# Patient Record
Sex: Female | Born: 1970 | Race: White | Hispanic: No | Marital: Married | Smoking: Never smoker
Health system: Southern US, Community
[De-identification: ages and names within clinical notes are randomized; demographics above are authoritative.]

## PROBLEM LIST (undated history)

## (undated) DIAGNOSIS — Z8711 Personal history of peptic ulcer disease: Secondary | ICD-10-CM

## (undated) HISTORY — PX: NO PAST SURGERIES: SHX2092

---

## 2005-07-14 ENCOUNTER — Ambulatory Visit: Payer: Self-pay | Admitting: Family Medicine

## 2006-09-16 ENCOUNTER — Other Ambulatory Visit: Admission: RE | Admit: 2006-09-16 | Discharge: 2006-09-16 | Payer: Self-pay | Admitting: Gynecology

## 2008-03-29 ENCOUNTER — Other Ambulatory Visit: Admission: RE | Admit: 2008-03-29 | Discharge: 2008-03-29 | Payer: Self-pay | Admitting: Gynecology

## 2009-08-23 ENCOUNTER — Encounter: Admission: RE | Admit: 2009-08-23 | Discharge: 2009-08-23 | Payer: Self-pay | Admitting: Gynecology

## 2010-06-27 ENCOUNTER — Encounter: Admission: RE | Admit: 2010-06-27 | Discharge: 2010-06-27 | Payer: Self-pay | Admitting: Gynecology

## 2012-08-17 ENCOUNTER — Other Ambulatory Visit: Payer: Self-pay | Admitting: Gynecology

## 2012-08-17 DIAGNOSIS — R928 Other abnormal and inconclusive findings on diagnostic imaging of breast: Secondary | ICD-10-CM

## 2012-08-25 ENCOUNTER — Ambulatory Visit
Admission: RE | Admit: 2012-08-25 | Discharge: 2012-08-25 | Disposition: A | Discharge status: Home / Self Care | Payer: Managed Care, Other (non HMO) | Source: Ambulatory Visit | Attending: Gynecology | Admitting: Gynecology

## 2012-08-25 DIAGNOSIS — R928 Other abnormal and inconclusive findings on diagnostic imaging of breast: Secondary | ICD-10-CM

## 2015-03-30 ENCOUNTER — Other Ambulatory Visit: Payer: Self-pay | Admitting: Family Medicine

## 2015-03-30 NOTE — Telephone Encounter (Signed)
Left msg for patient. Asked her to call with any questions.

## 2015-03-30 NOTE — Telephone Encounter (Signed)
She has finished the prescription vitamin D Have her start over-the-counter vitamin D3, take 2,000 iu daily We can recheck that level in another few months

## 2017-12-24 ENCOUNTER — Other Ambulatory Visit: Payer: Self-pay | Admitting: Obstetrics & Gynecology

## 2017-12-24 DIAGNOSIS — R928 Other abnormal and inconclusive findings on diagnostic imaging of breast: Secondary | ICD-10-CM

## 2017-12-30 ENCOUNTER — Ambulatory Visit
Admission: RE | Admit: 2017-12-30 | Discharge: 2017-12-30 | Disposition: A | Discharge status: Home / Self Care | Payer: Managed Care, Other (non HMO) | Source: Ambulatory Visit | Attending: Obstetrics & Gynecology | Admitting: Obstetrics & Gynecology

## 2017-12-30 ENCOUNTER — Other Ambulatory Visit: Payer: Self-pay | Admitting: Obstetrics & Gynecology

## 2017-12-30 DIAGNOSIS — R921 Mammographic calcification found on diagnostic imaging of breast: Secondary | ICD-10-CM

## 2017-12-30 DIAGNOSIS — R928 Other abnormal and inconclusive findings on diagnostic imaging of breast: Secondary | ICD-10-CM

## 2018-07-07 ENCOUNTER — Other Ambulatory Visit: Payer: Self-pay | Admitting: Obstetrics & Gynecology

## 2018-07-07 ENCOUNTER — Ambulatory Visit
Admission: RE | Admit: 2018-07-07 | Discharge: 2018-07-07 | Disposition: A | Discharge status: Home / Self Care | Payer: BLUE CROSS/BLUE SHIELD | Source: Ambulatory Visit | Attending: Obstetrics & Gynecology | Admitting: Obstetrics & Gynecology

## 2018-07-07 DIAGNOSIS — R921 Mammographic calcification found on diagnostic imaging of breast: Secondary | ICD-10-CM

## 2018-09-15 ENCOUNTER — Ambulatory Visit (INDEPENDENT_AMBULATORY_CARE_PROVIDER_SITE_OTHER): Payer: 59

## 2018-09-15 ENCOUNTER — Encounter: Payer: Self-pay | Admitting: Podiatry

## 2018-09-15 ENCOUNTER — Ambulatory Visit (INDEPENDENT_AMBULATORY_CARE_PROVIDER_SITE_OTHER): Payer: 59 | Admitting: Podiatry

## 2018-09-15 VITALS — BP 125/70 | HR 75 | Resp 16

## 2018-09-15 DIAGNOSIS — M2041 Other hammer toe(s) (acquired), right foot: Secondary | ICD-10-CM

## 2018-09-15 DIAGNOSIS — M722 Plantar fascial fibromatosis: Secondary | ICD-10-CM

## 2018-09-15 MED ORDER — MELOXICAM 15 MG PO TABS: 15.0000 mg | ORAL_TABLET | Freq: Every day | ORAL | 3 refills | Status: DC

## 2018-09-15 MED ORDER — METHYLPREDNISOLONE 4 MG PO TBPK: ORAL_TABLET | ORAL | 0 refills | Status: DC

## 2018-09-15 NOTE — Progress Notes (Signed)
  Subjective:  Patient ID: Kelly Berg, female    DOB: 03/12/1971,  MRN: [id number] HPI Chief Complaint  Patient presents with  . Foot Pain    Plantar heel left - aching x 1 week, AM pain, tried Ibuprofen, history of PF  . Toe Pain    2nd toe right - hammer toe deformity x years, getting worse, shoes uncomfortable  . New Patient (Initial Visit)    48 y.o. female presents with the above complaint.   ROS: Denies fever chills nausea vomiting muscle aches pains calf pain back pain chest pain shortness of breath.  No past medical history on file. No past surgical history on file.  Current Outpatient Medications:  .  meloxicam (MOBIC) 15 MG tablet, Take 1 tablet (15 mg total) by mouth daily., Disp: 30 tablet, Rfl: 3 .  methylPREDNISolone (MEDROL DOSEPAK) 4 MG TBPK tablet, 6 day dose pack - take as directed, Disp: 21 tablet, Rfl: 0  Allergies  Allergen Reactions  . Amoxicillin Hives  . Codeine Nausea Only  . Guaifenesin     Other reaction(s): Vomiting   Review of Systems Objective:   Vitals:   09/15/18 0934  BP: 125/70  Pulse: 75  Resp: 16    General: Well developed, nourished, in no acute distress, alert and oriented x3   Dermatological: Skin is warm, dry and supple bilateral. Nails x 10 are well maintained; remaining integument appears unremarkable at this time. There are no open sores, no preulcerative lesions, no rash or signs of infection present.  Vascular: Dorsalis Pedis artery and Posterior Tibial artery pedal pulses are 2/4 bilateral with immedate capillary fill time. Pedal hair growth present. No varicosities and no lower extremity edema present bilateral.   Neruologic: Grossly intact via light touch bilateral. Vibratory intact via tuning fork bilateral. Protective threshold with Semmes Wienstein monofilament intact to all pedal sites bilateral. Patellar and Achilles deep tendon reflexes 2+ bilateral. No Babinski or clonus noted bilateral.   Musculoskeletal: No  gross boney pedal deformities bilateral. No pain, crepitus, or limitation noted with foot and ankle range of motion bilateral. Muscular strength 5/5 in all groups tested bilateral.  Gait: Unassisted, Nonantalgic.    Radiographs:  Radiographs taken today demonstrate severe hallux valgus deformity of the right foot with severe hammertoe deformity second left and a plantarflexed second metatarsal right.  Soft tissue increase in density plantar fascial kidney insertion site of the left heel.  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  Plantar fasciitis left.  Plan: Discussed etiology pathology and surgical therapies at this point injected 20 mg Kenalog 5 mg Marcaine point maximal tenderness of the left to left sterile Betadine skin prep.  Tolerated procedure well without complications.  Started her on a Medrol Dosepak to be followed by meloxicam.  She has a plantar fascial night splint at home but we dispensed a plantar fascial brace.  Gust appropriate shoe gear stretching exercise ice therapy.  Great detail today we discussed the reduction of her bunion deformity with a Lapidus procedure bunionectomy and a second metatarsal osteotomy with hammertoe repair.  She understands all this is amenable to it would like for her husband to hear this so we will bring her husband with her on follow-up visit and follow-up in 1 month to discuss not only surgery but to reevaluate the plantar fasciitis left.      T. [place], [place]

## 2018-09-15 NOTE — Patient Instructions (Signed)

## 2018-10-20 ENCOUNTER — Ambulatory Visit: Payer: 59 | Admitting: Podiatry

## 2018-10-27 ENCOUNTER — Encounter: Payer: Self-pay | Admitting: Podiatry

## 2018-10-27 ENCOUNTER — Ambulatory Visit (INDEPENDENT_AMBULATORY_CARE_PROVIDER_SITE_OTHER): Payer: 59 | Admitting: Podiatry

## 2018-10-27 DIAGNOSIS — M722 Plantar fascial fibromatosis: Secondary | ICD-10-CM

## 2018-10-27 MED ORDER — DICLOFENAC SODIUM 75 MG PO TBEC: 75.0000 mg | DELAYED_RELEASE_TABLET | Freq: Two times a day (BID) | ORAL | 1 refills | Status: DC

## 2018-10-27 NOTE — Progress Notes (Signed)
She presents today for follow-up of her left heel.  She states that seems to be feeling better.  He still have some questions regarding the surgery as she refers to her right foot.  Objective: Vital signs are stable she is alert and oriented x3.  Pulses are palpable.  She has mild tenderness on palpation medial calcaneal tubercle of the left heel.  She has some questions regarding the Lapidus procedure second metatarsal osteotomy and hammertoe repair second right.  Answered all those the best my ability layman's terms she understood was amenable to it.  Assessment: Slowly resolving plantar fasciitis left.  Hallux valgus deformity right.  Plan: Answer all the questions regarding these procedures best my billing layman's terms she understood was amenable to it.  I also reinjected the left heel today 20 mg Kenalog 5 mg Marcaine for maximal tenderness.  I will follow-up with her when she is decided about when she would like to have this done.

## 2018-12-08 ENCOUNTER — Ambulatory Visit: Payer: 59 | Admitting: Podiatry

## 2019-01-05 ENCOUNTER — Ambulatory Visit: Payer: 59 | Admitting: Podiatry

## 2019-02-02 ENCOUNTER — Other Ambulatory Visit: Payer: Self-pay

## 2019-02-02 ENCOUNTER — Ambulatory Visit (INDEPENDENT_AMBULATORY_CARE_PROVIDER_SITE_OTHER): Payer: 59 | Admitting: Podiatry

## 2019-02-02 ENCOUNTER — Encounter: Payer: Self-pay | Admitting: Podiatry

## 2019-02-02 VITALS — Temp 98.2°F

## 2019-02-02 DIAGNOSIS — M722 Plantar fascial fibromatosis: Secondary | ICD-10-CM

## 2019-02-02 NOTE — Progress Notes (Signed)
Follow-up left heel it was okay until I wore some sandals now starting to hurt again.  Objective: Vital signs are stable alert and oriented x3.  Pulses palpable.  She has pain to palpation medial [place] tubercle left heel.  Assessment: Plantar fasciitis chronic in nature left.  Plan: Went ahead and injected the left heel again today 20 mg Kenalog 5 mg Marcaine point of maximal tenderness.  Tolerated procedure well follow-up with me in a month if necessary.

## 2019-02-16 ENCOUNTER — Other Ambulatory Visit: Payer: Self-pay

## 2019-02-16 ENCOUNTER — Ambulatory Visit
Admission: RE | Admit: 2019-02-16 | Discharge: 2019-02-16 | Disposition: A | Discharge status: Home / Self Care | Payer: 59 | Source: Ambulatory Visit | Attending: Obstetrics & Gynecology | Admitting: Obstetrics & Gynecology

## 2019-02-16 DIAGNOSIS — R921 Mammographic calcification found on diagnostic imaging of breast: Secondary | ICD-10-CM

## 2019-05-03 ENCOUNTER — Other Ambulatory Visit: Payer: Self-pay

## 2019-05-03 ENCOUNTER — Ambulatory Visit
Admission: EM | Admit: 2019-05-03 | Discharge: 2019-05-03 | Disposition: A | Discharge status: Home / Self Care | Payer: 59 | Attending: Family Medicine | Admitting: Family Medicine

## 2019-05-03 DIAGNOSIS — W5501XA Bitten by cat, initial encounter: Secondary | ICD-10-CM

## 2019-05-03 DIAGNOSIS — S61207A Unspecified open wound of left little finger without damage to nail, initial encounter: Secondary | ICD-10-CM | POA: Diagnosis not present

## 2019-05-03 MED ORDER — METRONIDAZOLE 500 MG PO TABS: 500.0000 mg | ORAL_TABLET | Freq: Three times a day (TID) | ORAL | 0 refills | Status: AC

## 2019-05-03 MED ORDER — DOXYCYCLINE HYCLATE 100 MG PO CAPS: 100.0000 mg | ORAL_CAPSULE | Freq: Two times a day (BID) | ORAL | 0 refills | Status: AC

## 2019-05-03 NOTE — ED Triage Notes (Addendum)
Patient complains of cat bite to her left hand little finger. Patient states that this occurred while at work and cat is UTD on vaccines.

## 2019-05-03 NOTE — Discharge Instructions (Signed)
Keep dressing on for 24 hours.  Medication as directed.  Take care  Dr. Lacinda Axon

## 2019-05-04 NOTE — ED Provider Notes (Signed)
[facility]    CSN: [id number] Arrival date & time: 05/03/19  1634  History   Chief Complaint Chief Complaint  Patient presents with  . Animal Bite    left ring finger   HPI  48 year old female presents with the above complaint.  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  She states that this is not filing this as a Designer, multimedia[employer]men's Comp. injury.  She reports she was bitten by cat approximately 1 hour prior to arrival.  States that she was bitten on her left fifth digit.  She states that the cat is up-to-date on her vaccinations.  Wound is currently bleeding.  Pain is mild, 2/10 in severity.  He has cleaned the area with surgical scrub and has a dressing applied.  No medications or other interventions tried.  Area continues to bleed.  No other reported injuries.  No other complaints or concerns at this time.  PMH, Surgical Hx, Family Hx, Social History reviewed and updated as below.  PMH: Obesity, Plantar fasciitis   Past Surgical History:  Procedure Laterality Date  . NO PAST SURGERIES      OB History   No obstetric history on file.    Home Medications    Prior to Admission medications   Medication Sig Start Date End Date Taking? Authorizing Provider  doxycycline (VIBRAMYCIN) 100 MG capsule Take 1 capsule (100 mg total) by mouth 2 (two) times daily for 7 days. 05/03/19 05/10/19  Tommie Samsook, Hiroto Saltzman G, DO  metroNIDAZOLE (FLAGYL) 500 MG tablet Take 1 tablet (500 mg total) by mouth 3 (three) times daily for 7 days. 05/03/19 05/10/19  Tommie Samsook, Simrin Vegh G, DO    Family History Family History  Problem Relation Age of Onset  . Breast cancer Cousin 40  . Cancer Mother   . Diabetes Mother     Social History Social History   Tobacco Use  . Smoking status: Never Smoker  . Smokeless tobacco: Never Used  Substance Use Topics  . Alcohol use: Not Currently    Frequency: Never  . Drug use: Never     Allergies   Amoxicillin, Codeine, and Guaifenesin   Review of Systems Review of  Systems  Constitutional: Negative.   Skin: Positive for wound.   Physical Exam Triage Vital Signs ED Triage Vitals  Enc Vitals Group     BP 05/03/19 1652 (!) 147/89     Pulse Rate 05/03/19 1652 77     Resp 05/03/19 1652 16     Temp 05/03/19 1652 98.2 F (36.8 C)     Temp Source 05/03/19 1652 Oral     SpO2 05/03/19 1652 97 %     Weight 05/03/19 1651 200 lb (90.7 kg)     Height 05/03/19 1651 5\' 8"  (1.727 m)     Head Circumference --      Peak Flow --      Pain Score 05/03/19 1650 2     Pain Loc --      Pain Edu? --      Excl. in GC? --    Updated Vital Signs BP (!) 147/89 (BP Location: Left Arm)   Pulse 77   Temp 98.2 F (36.8 C) (Oral)   Resp 16   Ht 5\' 8"  (1.727 m)   Wt 90.7 kg   SpO2 97%   BMI 30.41 kg/m   Visual Acuity Right Eye Distance:   Left Eye Distance:   Bilateral Distance:    Right Eye Near:   Left Eye Near:  Bilateral Near:     Physical Exam Vitals signs and nursing note reviewed.  Constitutional:      General: She is not in acute distress.    Appearance: Normal appearance. She is not ill-appearing.  HENT:     Head: Normocephalic and atraumatic.  Eyes:     General:        Right eye: No discharge.        Left eye: No discharge.     Conjunctiva/sclera: Conjunctivae normal.  Cardiovascular:     Rate and Rhythm: Normal rate and regular rhythm.  Pulmonary:     Effort: Pulmonary effort is normal.     Breath sounds: Normal breath sounds. No wheezing, rhonchi or rales.  Skin:    Comments: Left 5th digit - small, open bleeding wound noted just distal to the DIP joint (palmar aspect).  Neurological:     Mental Status: She is alert.  Psychiatric:        Mood and Affect: Mood normal.        Behavior: Behavior normal.    UC Treatments / Results  Labs (all labs ordered are listed, but only abnormal results are displayed) Labs Reviewed - No data to display  EKG   Radiology No results found.  Procedures Procedures (including critical  care time)  Medications Ordered in UC Medications - No data to display  Initial Impression / Assessment and Plan / UC Course  I have reviewed the triage vital signs and the nursing notes.  Pertinent labs & imaging results that were available during my care of the patient were reviewed by me and considered in my medical decision making (see chart for details).    48 year old female presents with a cat bite.  Wound left open.  Wound was dressed today.  Placing on doxycycline and Flagyl as patient is allergic to amoxicillin.  Final Clinical Impressions(s) / UC Diagnoses   Final diagnoses:  Cat bite, initial encounter     Discharge Instructions     Keep dressing on for 24 hours.  Medication as directed.  Take care  Dr. Lacinda Axon    ED Prescriptions    Medication Sig Dispense Auth. Provider   doxycycline (VIBRAMYCIN) 100 MG capsule Take 1 capsule (100 mg total) by mouth 2 (two) times daily for 7 days. 14 capsule Ketih Goodie G, DO   metroNIDAZOLE (FLAGYL) 500 MG tablet Take 1 tablet (500 mg total) by mouth 3 (three) times daily for 7 days. 21 tablet Coral Spikes, DO     Controlled Substance Prescriptions [place] Controlled Substance Registry consulted? Not Applicable   Coral Spikes, [place] 05/04/19 9150

## 2019-09-01 ENCOUNTER — Ambulatory Visit: Payer: Managed Care, Other (non HMO) | Attending: Internal Medicine

## 2019-09-01 DIAGNOSIS — Z20822 Contact with and (suspected) exposure to covid-19: Secondary | ICD-10-CM

## 2019-09-02 LAB — NOVEL CORONAVIRUS, NAA: SARS-CoV-2, NAA: NOT DETECTED

## 2019-09-16 ENCOUNTER — Ambulatory Visit: Payer: Managed Care, Other (non HMO) | Attending: Internal Medicine

## 2019-09-16 DIAGNOSIS — Z20822 Contact with and (suspected) exposure to covid-19: Secondary | ICD-10-CM

## 2019-09-18 LAB — NOVEL CORONAVIRUS, NAA: SARS-CoV-2, NAA: NOT DETECTED

## 2019-09-28 DIAGNOSIS — S86819A Strain of other muscle(s) and tendon(s) at lower leg level, unspecified leg, initial encounter: Secondary | ICD-10-CM | POA: Insufficient documentation

## 2020-01-31 DIAGNOSIS — M7062 Trochanteric bursitis, left hip: Secondary | ICD-10-CM | POA: Insufficient documentation

## 2020-03-28 ENCOUNTER — Other Ambulatory Visit: Payer: Self-pay | Admitting: Obstetrics & Gynecology

## 2020-03-28 DIAGNOSIS — Z1231 Encounter for screening mammogram for malignant neoplasm of breast: Secondary | ICD-10-CM

## 2020-03-28 DIAGNOSIS — R921 Mammographic calcification found on diagnostic imaging of breast: Secondary | ICD-10-CM

## 2020-04-10 ENCOUNTER — Other Ambulatory Visit: Payer: Self-pay | Admitting: Obstetrics and Gynecology

## 2020-04-25 ENCOUNTER — Ambulatory Visit
Admission: RE | Admit: 2020-04-25 | Discharge: 2020-04-25 | Disposition: A | Discharge status: Home / Self Care | Payer: BC Managed Care – PPO | Source: Ambulatory Visit | Attending: Obstetrics & Gynecology | Admitting: Obstetrics & Gynecology

## 2020-04-25 ENCOUNTER — Other Ambulatory Visit: Payer: Self-pay

## 2020-04-25 DIAGNOSIS — R921 Mammographic calcification found on diagnostic imaging of breast: Secondary | ICD-10-CM

## 2020-11-04 ENCOUNTER — Ambulatory Visit
Admission: RE | Admit: 2020-11-04 | Discharge: 2020-11-04 | Disposition: A | Discharge status: Home / Self Care | Payer: BC Managed Care – PPO | Source: Ambulatory Visit | Attending: Family Medicine | Admitting: Family Medicine

## 2020-11-04 ENCOUNTER — Other Ambulatory Visit: Payer: Self-pay

## 2020-11-04 ENCOUNTER — Ambulatory Visit (INDEPENDENT_AMBULATORY_CARE_PROVIDER_SITE_OTHER): Payer: BC Managed Care – PPO

## 2020-11-04 VITALS — BP 129/95 | HR 91 | Temp 98.6°F | Resp 16 | Wt 192.0 lb

## 2020-11-04 DIAGNOSIS — J189 Pneumonia, unspecified organism: Secondary | ICD-10-CM | POA: Diagnosis not present

## 2020-11-04 DIAGNOSIS — R070 Pain in throat: Secondary | ICD-10-CM

## 2020-11-04 DIAGNOSIS — R0981 Nasal congestion: Secondary | ICD-10-CM

## 2020-11-04 DIAGNOSIS — R0602 Shortness of breath: Secondary | ICD-10-CM | POA: Diagnosis not present

## 2020-11-04 DIAGNOSIS — R059 Cough, unspecified: Secondary | ICD-10-CM | POA: Diagnosis not present

## 2020-11-04 HISTORY — DX: Personal history of peptic ulcer disease: Z87.11

## 2020-11-04 MED ORDER — CEFDINIR 300 MG PO CAPS: 300.0000 mg | ORAL_CAPSULE | Freq: Two times a day (BID) | ORAL | 0 refills | Status: AC

## 2020-11-04 MED ORDER — AZITHROMYCIN 250 MG PO TABS: ORAL_TABLET | ORAL | 0 refills | Status: AC

## 2020-11-04 MED ORDER — PROMETHAZINE-DM 6.25-15 MG/5ML PO SYRP: 5.0000 mL | ORAL_SOLUTION | Freq: Four times a day (QID) | ORAL | 0 refills | Status: AC | PRN

## 2020-11-04 NOTE — ED Triage Notes (Signed)
Pt c/o respiratory symptoms that have been on-going for 2.5 weeks she had a negative test two weeks ago and two recent rapid test all negative. She is unsure if symptoms are allergy related symptoms/ The past couple days her cough and nasal congestion has worsened. She had a telehealth appt Thursday and was prescribed cold/cough medications that has provided some relief.  Denies fever, abdominal pain.

## 2020-11-04 NOTE — ED Provider Notes (Signed)
[facility]    CSN: [id number] Arrival date & time: 11/04/20  1447      History   Chief Complaint Cough, congestion  HPI  50 year old female presents with respiratory symptoms.  Patient reports that approximately 2 weeks ago she was sick with scratchy throat, congestion.  Subsequently resolved after a few days.  She then developed symptoms again this past Sunday.  She reports postnasal drip.  Worsened on Wednesday with congestion, sore throat, and cough.  No documented fever.  She states her cough is productive.  This is her primary complaint at this time.  She has taken some over-the-counter medications and medications prescribed by a telehealth doctor without resolution.  Negative Covid testing.  No other complaints.  Past Medical History:  Diagnosis Date  . History of stomach ulcers     Past Surgical History:  Procedure Laterality Date  . NO PAST SURGERIES      OB History   No obstetric history on file.      Home Medications    Prior to Admission medications   Medication Sig Start Date End Date Taking? Authorizing Provider  azithromycin (ZITHROMAX) 250 MG tablet 2 tablets on day 1, then 1 tablet daily on days 2-5. 11/04/20  Yes Tahj Lindseth G, DO  cefdinir (OMNICEF) 300 MG capsule Take 1 capsule (300 mg total) by mouth 2 (two) times daily. 11/04/20  Yes Adriana Simas, Ellis Mehaffey G, DO  OMEPRAZOLE PO omeprazole ER 20 mg capsule,extended release 11/21/19  Yes [provider]  promethazine-dextromethorphan (PROMETHAZINE-DM) 6.25-15 MG/5ML syrup Take 5 mLs by mouth 4 (four) times daily as needed for cough. 11/04/20  Yes Tommie Sams, DO  Misc Natural Products (JOINT SUPPORT PO)     [provider]  VITAMIN D PO Vitamin D    [provider]    Family History Family History  Problem Relation Age of Onset  . Breast cancer Cousin 40  . Cancer Mother   . Diabetes Mother     Social History Social History   Tobacco Use  . Smoking status: Never  Smoker  . Smokeless tobacco: Never Used  Vaping Use  . Vaping Use: Never used  Substance Use Topics  . Alcohol use: Not Currently  . Drug use: Never     Allergies   Amoxicillin, Codeine, and Guaifenesin   Review of Systems Review of Systems Per HPI  Physical Exam Triage Vital Signs ED Triage Vitals  Enc Vitals Group     BP 11/04/20 1509 (!) 129/95     Pulse Rate 11/04/20 1509 91     Resp 11/04/20 1509 16     Temp 11/04/20 1509 98.6 F (37 C)     Temp Source 11/04/20 1509 Oral     SpO2 11/04/20 1509 98 %     Weight 11/04/20 1503 192 lb (87.1 kg)     Height --      Head Circumference --      Peak Flow --      Pain Score 11/04/20 1502 0     Pain Loc --      Pain Edu? --      Excl. in GC? --    Updated Vital Signs BP (!) 129/95 (BP Location: Left Arm)   Pulse 91   Temp 98.6 F (37 C) (Oral)   Resp 16   Wt 87.1 kg   SpO2 98%   BMI 29.19 kg/m   Visual Acuity Right Eye Distance:   Left Eye Distance:  Bilateral Distance:    Right Eye Near:   Left Eye Near:    Bilateral Near:     Physical Exam Vitals and nursing note reviewed.  Constitutional:      General: She is not in acute distress.    Appearance: Normal appearance. She is not ill-appearing.  HENT:     Head: Normocephalic and atraumatic.  Eyes:     General:        Right eye: No discharge.        Left eye: No discharge.     Conjunctiva/sclera: Conjunctivae normal.  Cardiovascular:     Rate and Rhythm: Normal rate and regular rhythm.     Heart sounds: No murmur heard.   Pulmonary:     Effort: Pulmonary effort is normal.     Comments: Left basilar crackles Neurological:     Mental Status: She is alert.  Psychiatric:        Mood and Affect: Mood normal.        Behavior: Behavior normal.    UC Treatments / Results  Labs (all labs ordered are listed, but only abnormal results are displayed) Labs Reviewed - No data to display  EKG   Radiology DG Chest 2 View  Result Date:  11/04/2020 CLINICAL DATA:  Sore throat with shortness of breath, cough and nasal congestion 2-3 weeks. Negative COVID test 2 weeks ago. EXAM: CHEST - 2 VIEW COMPARISON:  None. FINDINGS: Lungs are adequately inflated with subtle hazy density over the left base which may be due to atelectasis or infection. No effusion. Cardiomediastinal silhouette and remainder of the exam is unremarkable. IMPRESSION: Subtle hazy density left base which may be due to atelectasis or infection. Electronically Signed   By: Elberta Fortis M.D.   On: 11/04/2020 15:43    Procedures Procedures (including critical care time)  Medications Ordered in UC Medications - No data to display  Initial Impression / Assessment and Plan / UC Course  I have reviewed the triage vital signs and the nursing notes.  Pertinent labs & imaging results that were available during my care of the patient were reviewed by me and considered in my medical decision making (see chart for details).    50 year old female presents with respiratory symptoms.  Chest x-ray was obtained given lung findings.  Chest x-ray was independently interpreted.  Left lower lobe opacity noted.  This is consistent with community-acquired pneumonia.  Treating with Omnicef and azithromycin.  Promethazine DM for cough.  Final Clinical Impressions(s) / UC Diagnoses   Final diagnoses:  Pneumonia of left lower lobe due to infectious organism   Discharge Instructions   None    ED Prescriptions    Medication Sig Dispense Auth. Provider   cefdinir (OMNICEF) 300 MG capsule Take 1 capsule (300 mg total) by mouth 2 (two) times daily. 14 capsule Javeria Briski G, DO   azithromycin (ZITHROMAX) 250 MG tablet 2 tablets on day 1, then 1 tablet daily on days 2-5. 6 tablet Treyveon Mochizuki G, DO   promethazine-dextromethorphan (PROMETHAZINE-DM) 6.25-15 MG/5ML syrup Take 5 mLs by mouth 4 (four) times daily as needed for cough. 118 mL Tommie Sams, DO     PDMP not reviewed this  encounter.   Tommie Sams, [place] 11/04/20 1617

## 2020-12-04 DIAGNOSIS — K219 Gastro-esophageal reflux disease without esophagitis: Secondary | ICD-10-CM | POA: Insufficient documentation

## 2021-05-28 ENCOUNTER — Other Ambulatory Visit: Payer: Self-pay | Admitting: Obstetrics & Gynecology

## 2021-05-28 DIAGNOSIS — Z1231 Encounter for screening mammogram for malignant neoplasm of breast: Secondary | ICD-10-CM

## 2021-06-10 ENCOUNTER — Ambulatory Visit
Admission: RE | Admit: 2021-06-10 | Discharge: 2021-06-10 | Disposition: A | Discharge status: Home / Self Care | Payer: BC Managed Care – PPO | Source: Ambulatory Visit | Attending: Obstetrics & Gynecology | Admitting: Obstetrics & Gynecology

## 2021-06-10 ENCOUNTER — Other Ambulatory Visit: Payer: Self-pay

## 2021-06-10 DIAGNOSIS — Z1231 Encounter for screening mammogram for malignant neoplasm of breast: Secondary | ICD-10-CM

## 2021-12-24 DIAGNOSIS — J302 Other seasonal allergic rhinitis: Secondary | ICD-10-CM | POA: Insufficient documentation

## 2022-06-04 ENCOUNTER — Other Ambulatory Visit: Payer: Self-pay | Admitting: Obstetrics & Gynecology

## 2022-06-07 ENCOUNTER — Ambulatory Visit
Admission: EM | Admit: 2022-06-07 | Discharge: 2022-06-07 | Disposition: A | Discharge status: Home / Self Care | Payer: BC Managed Care – PPO | Attending: Urgent Care | Admitting: Urgent Care

## 2022-06-07 DIAGNOSIS — B3781 Candidal esophagitis: Secondary | ICD-10-CM | POA: Insufficient documentation

## 2022-06-07 DIAGNOSIS — R6889 Other general symptoms and signs: Secondary | ICD-10-CM

## 2022-06-07 DIAGNOSIS — U071 COVID-19: Secondary | ICD-10-CM | POA: Diagnosis not present

## 2022-06-07 NOTE — ED Triage Notes (Signed)
Pt. States she has been having a cough, fever and body aches that started this morning. Pt. Has been treating herself w/ OTC medications and no relief.

## 2022-06-07 NOTE — ED Provider Notes (Signed)
[facility]    CSN: [id number] Arrival date & time: 06/07/22  1029      History   Chief Complaint Chief Complaint  Patient presents with   Fever   Cough   Generalized Body Aches    HPI Kelly Berg is a 51 y.o. female.    Fever Associated symptoms: cough   Cough Associated symptoms: fever     Patient presents to urgent care with report of symptoms starting last evening: chills. She took tylenol and felt better in the night. Awoke with worsening symptoms including Cough, fever, body aches.  Self treating with OTC medication with some relief.  Past Medical History:  Diagnosis Date   History of stomach ulcers     Patient Active Problem List   Diagnosis Date Noted   Candidiasis of esophagus (HCC) 06/07/2022   Seasonal allergies 12/24/2021   Gastroesophageal reflux disease without esophagitis 12/04/2020   Trochanteric bursitis of left hip 01/31/2020   Strain of calf muscle 09/28/2019    Past Surgical History:  Procedure Laterality Date   NO PAST SURGERIES      OB History   No obstetric history on file.      Home Medications    Prior to Admission medications   Medication Sig Start Date End Date Taking? Authorizing Provider  cetirizine (ZYRTEC) 10 MG tablet  12/24/21  Yes [provider]  famotidine (PEPCID) 40 MG tablet 1 tab(s) orally 2 times a day for 30 days 11/28/21  Yes [provider]  omeprazole (PRILOSEC) 40 MG capsule Take 1 tablet by mouth daily. 12/04/20  Yes [provider]  sucralfate (CARAFATE) 1 g tablet 1 tab(s) orally 4 times a day (between meals and at bedtime) for 30 day(s) 11/28/21  Yes [provider]  azithromycin (ZITHROMAX) 250 MG tablet 2 tablets on day 1, then 1 tablet daily on days 2-5. 11/04/20   Tommie Sams, DO  cefdinir (OMNICEF) 300 MG capsule Take 1 capsule (300 mg total) by mouth 2 (two) times daily. 11/04/20   Tommie Sams, DO  Cetirizine HCl 0.24 % SOLN Cetirizine     [provider]  cyclobenzaprine (FLEXERIL) 10 MG tablet cyclobenzaprine 10 mg tablet  TAKE 1 TABLET BY MOUTH EVERYDAY AT BEDTIME    [provider]  doxycycline (ADOXA) 100 MG tablet TAKE 1 TABLET BY MOUTH TWICE A DAY FOR 7 DAYS    [provider]  fluconazole (DIFLUCAN) 100 MG tablet Take by mouth. 12/11/21   [provider]  Misc Natural Products (JOINT SUPPORT COMPLEX PO) Joint Support Complex    [provider]  Misc Natural Products (JOINT SUPPORT PO)     [provider]  misoprostol (CYTOTEC) 200 MCG tablet INSERT ONE TABLET PER VAGINA THE NIGHT PRIOR TO PROCEDURE    [provider]  Omeprazole Magnesium (PRILOSEC PO)     [provider]  OMEPRAZOLE PO omeprazole ER 20 mg capsule,extended release 11/21/19   [provider]  promethazine-dextromethorphan (PROMETHAZINE-DM) 6.25-15 MG/5ML syrup Take 5 mLs by mouth 4 (four) times daily as needed for cough. 11/04/20   Tommie Sams, DO  traMADol (ULTRAM) 50 MG tablet Take 1 tablet every 6 hours by oral route as needed.    [provider]  VITAMIN D PO Vitamin D    [provider]    Family History Family History  Problem Relation Age of Onset   Breast cancer Cousin 37   Cancer Mother  Diabetes Mother     Social History Social History   Tobacco Use   Smoking status: Never   Smokeless tobacco: Never  Vaping Use   Vaping Use: Never used  Substance Use Topics   Alcohol use: Not Currently   Drug use: Never     Allergies   Amoxicillin, Codeine, and Guaifenesin   Review of Systems Review of Systems  Constitutional:  Positive for fever.  Respiratory:  Positive for cough.      Physical Exam Triage Vital Signs ED Triage Vitals  Enc Vitals Group     BP      Pulse      Resp      Temp      Temp src      SpO2      Weight      Height      Head Circumference      Peak Flow      Pain Score      Pain Loc      Pain Edu?       Excl. in [place]?    No data found.  Updated Vital Signs BP 134/81   Pulse 83   Temp 100.2 F (37.9 C)   Resp 16   SpO2 96%   Visual Acuity Right Eye Distance:   Left Eye Distance:   Bilateral Distance:    Right Eye Near:   Left Eye Near:    Bilateral Near:     Physical Exam   UC Treatments / Results  Labs (all labs ordered are listed, but only abnormal results are displayed) Labs Reviewed  RESP PANEL BY RT-PCR (RSV, FLU A&B, COVID)  [id number]    EKG   Radiology No results found.  Procedures Procedures (including critical care time)  Medications Ordered in UC Medications - No data to display  Initial Impression / Assessment and Plan / UC Course  I have reviewed the triage vital signs and the nursing notes.  Pertinent labs & imaging results that were available during my care of the patient were reviewed by me and considered in my medical decision making (see chart for details).   Suspect viral etiology for her symptoms.  Respiratory swab obtained and is pending.  Recommending OTC cold/flu medication for symptom relief.  Warned her about Tylenol in most cold/flu medication.   Final Clinical Impressions(s) / UC Diagnoses   Final diagnoses:  Flu-like symptoms  Trochanteric bursitis of left hip   Discharge Instructions   None    ED Prescriptions   None    PDMP not reviewed this encounter.   Rose Phi, [place] 06/07/22 1218

## 2022-06-07 NOTE — Discharge Instructions (Addendum)
Follow up here or with your primary care provider if your symptoms are worsening or not improving.     

## 2022-06-08 LAB — RESP PANEL BY RT-PCR (RSV, FLU A&B, COVID)  RVPGX2
Influenza A by PCR: NEGATIVE
Influenza B by PCR: NEGATIVE
Resp Syncytial Virus by PCR: NEGATIVE
SARS Coronavirus 2 by RT PCR: POSITIVE — AB

## 2023-01-19 IMAGING — MG MM DIGITAL SCREENING BILAT W/ TOMO AND CAD
8 series · 8 of 24 positions shown · non-contrast
Comparison: Previous exam(s).

CLINICAL DATA: Screening.

EXAM:
DIGITAL SCREENING BILATERAL MAMMOGRAM WITH TOMOSYNTHESIS AND CAD
TECHNIQUE: Bilateral screening digital craniocaudal and mediolateral oblique
mammograms were obtained. Bilateral screening digital breast
tomosynthesis was performed. The images were evaluated with
computer-aided detection.

[R MLO synth-2D]
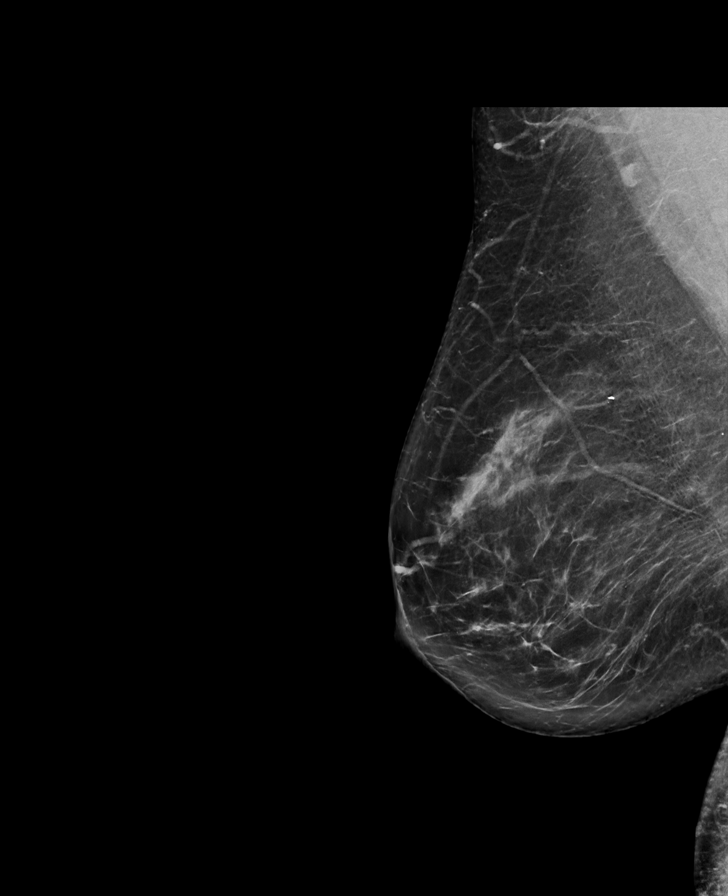

[R CC synth-2D]
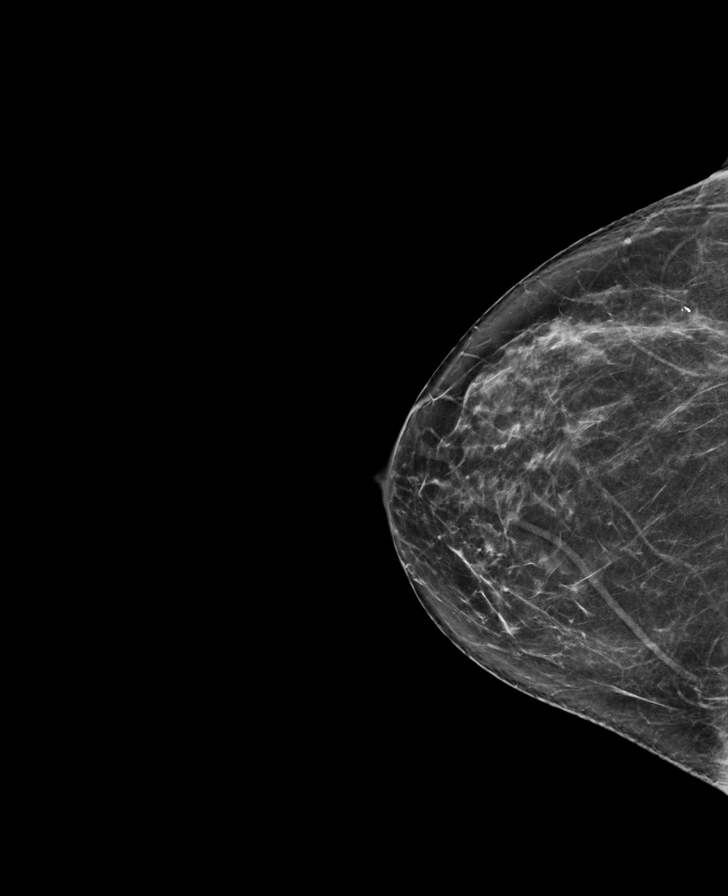

[L CC synth-2D]
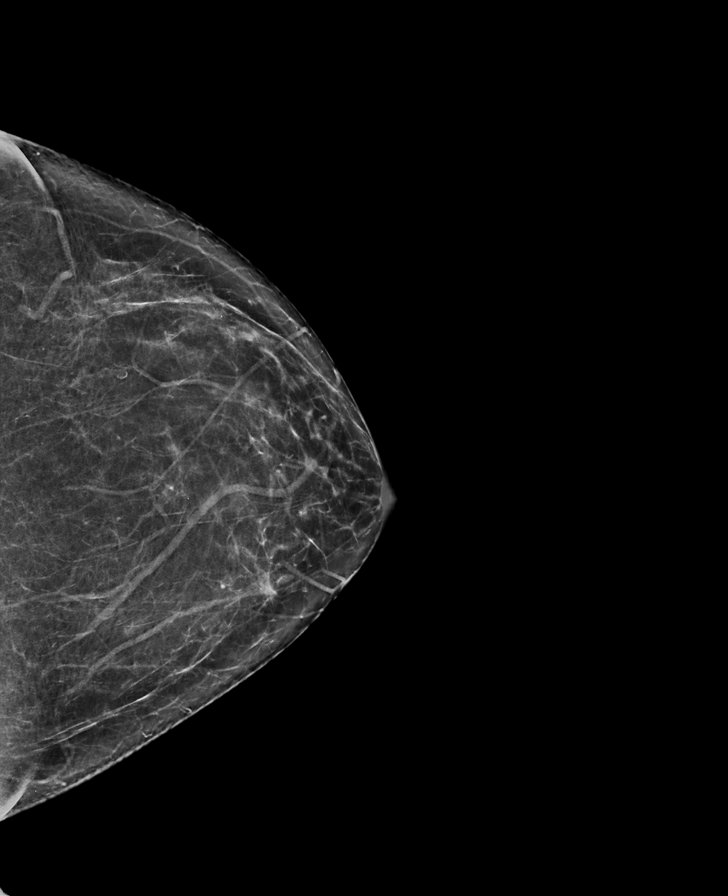

[L MLO synth-2D]
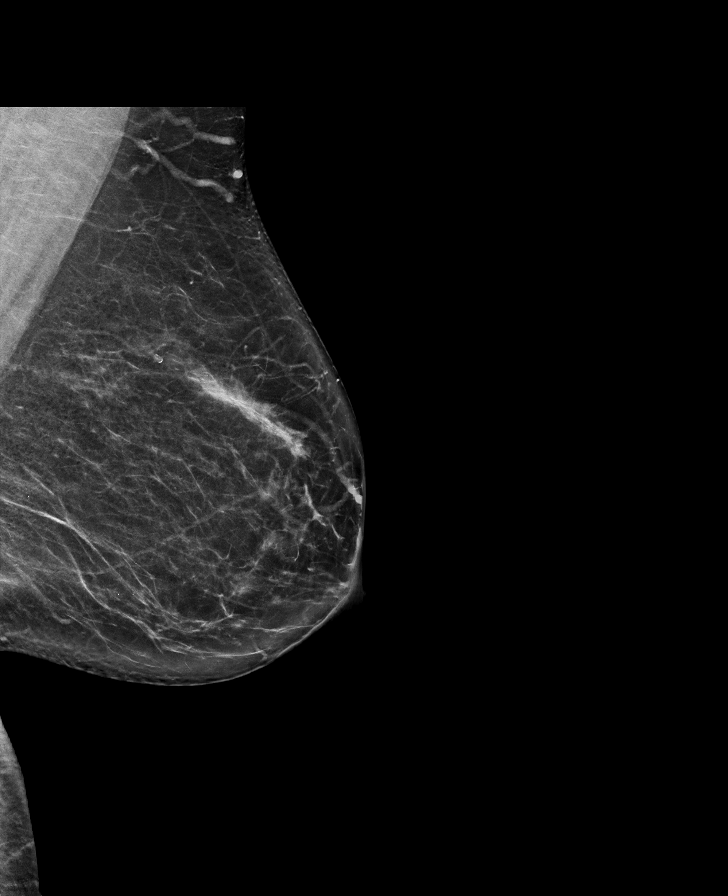

[L MLO tomo · tomo slice 40/79.0]
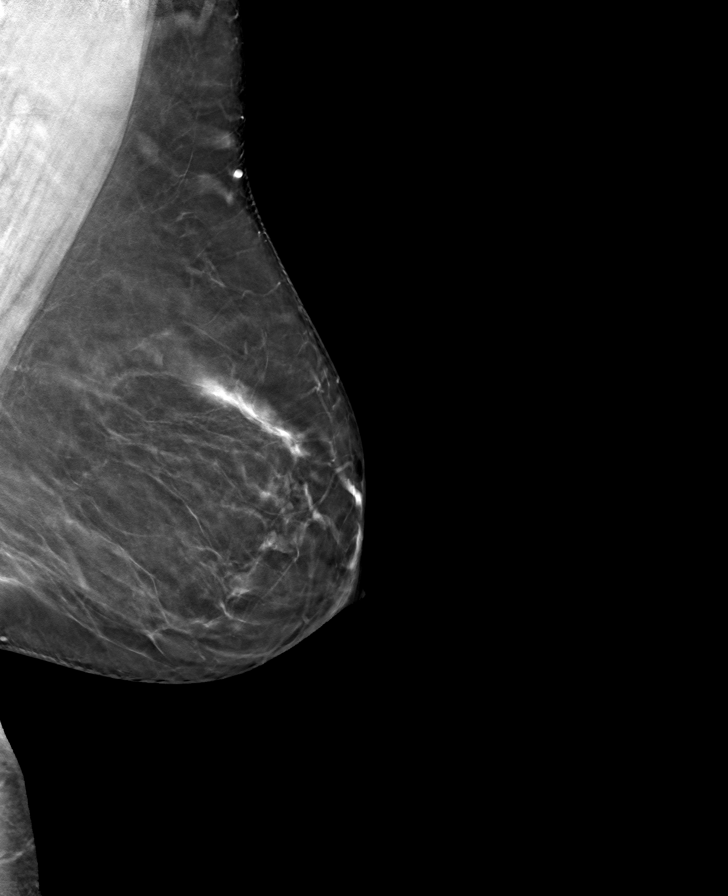

[R MLO tomo · tomo slice 41/81.0]
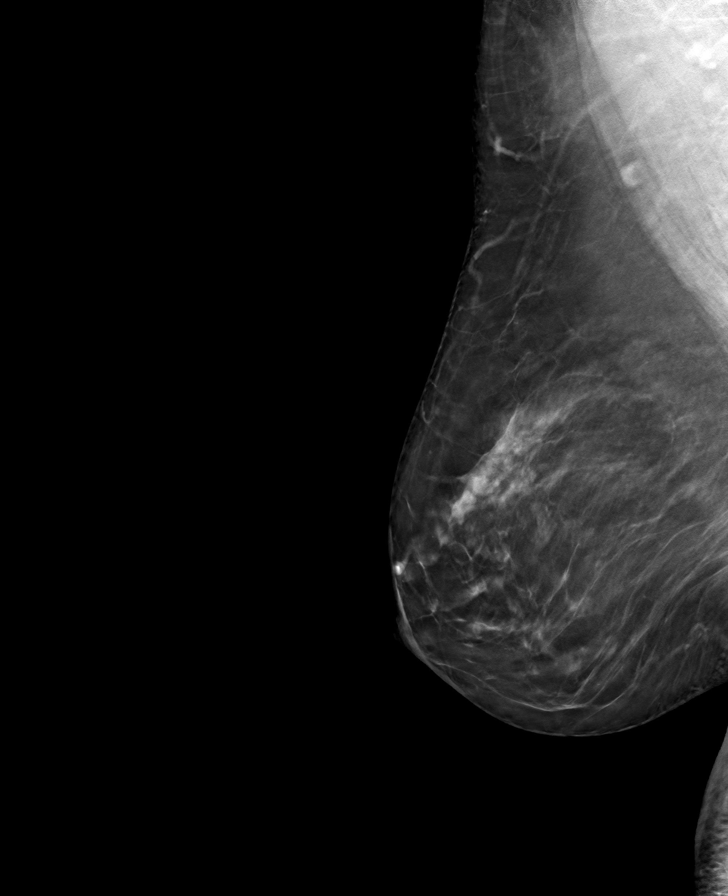

[R CC tomo · tomo slice 35/69.0]
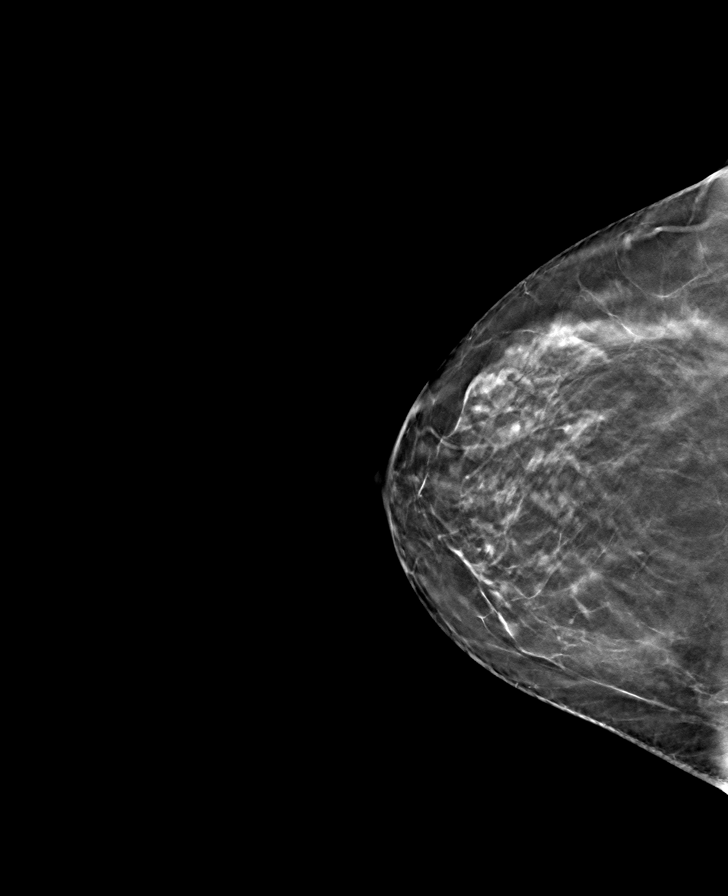

[L CC tomo · tomo slice 35/69.0]
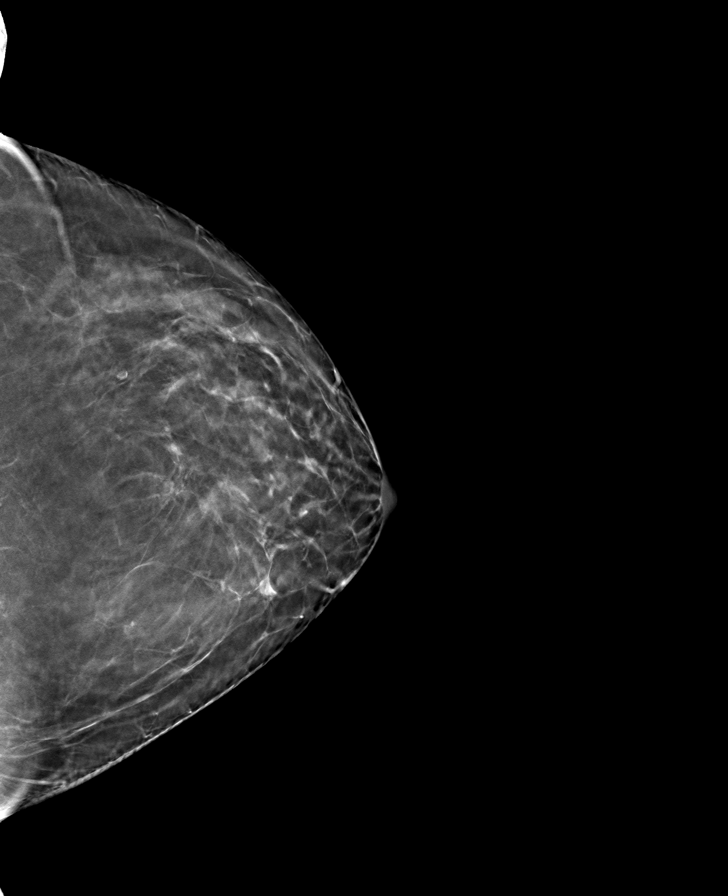

[8 of 24 positions shown; findings below may reference images not displayed]

ACR Breast Density Category b: There are scattered areas of
fibroglandular density.
FINDINGS: There are no findings suspicious for malignancy.
IMPRESSION: No mammographic evidence of malignancy. A result letter of this
screening mammogram will be mailed directly to the patient.

RECOMMENDATION:
Screening mammogram in one year. (Code:51-O-LD2)

BI-RADS CATEGORY  1: Negative.

## 2023-02-18 ENCOUNTER — Other Ambulatory Visit: Payer: Self-pay | Admitting: Gastroenterology

## 2023-02-18 DIAGNOSIS — R1310 Dysphagia, unspecified: Secondary | ICD-10-CM

## 2023-03-06 ENCOUNTER — Ambulatory Visit
Admission: RE | Admit: 2023-03-06 | Discharge: 2023-03-06 | Disposition: A | Discharge status: Home / Self Care | Payer: BC Managed Care – PPO | Source: Ambulatory Visit | Attending: Gastroenterology | Admitting: Gastroenterology

## 2023-03-06 DIAGNOSIS — R1310 Dysphagia, unspecified: Secondary | ICD-10-CM

## 2023-12-09 ENCOUNTER — Ambulatory Visit: Payer: BC Managed Care – PPO | Admitting: Audiologist

## 2024-04-21 ENCOUNTER — Other Ambulatory Visit: Payer: Self-pay | Admitting: Gastroenterology

## 2024-04-21 DIAGNOSIS — R1011 Right upper quadrant pain: Secondary | ICD-10-CM

## 2024-05-04 ENCOUNTER — Ambulatory Visit
Admission: RE | Admit: 2024-05-04 | Discharge: 2024-05-04 | Disposition: A | Discharge status: Home / Self Care | Source: Ambulatory Visit | Attending: Gastroenterology | Admitting: Gastroenterology

## 2024-05-04 DIAGNOSIS — R1011 Right upper quadrant pain: Secondary | ICD-10-CM | POA: Insufficient documentation

## 2024-05-10 ENCOUNTER — Other Ambulatory Visit (HOSPITAL_COMMUNITY): Payer: Self-pay | Admitting: Gastroenterology

## 2024-05-10 DIAGNOSIS — R1011 Right upper quadrant pain: Secondary | ICD-10-CM

## 2024-05-25 ENCOUNTER — Ambulatory Visit (HOSPITAL_COMMUNITY)
Admission: RE | Admit: 2024-05-25 | Discharge: 2024-05-25 | Disposition: A | Discharge status: Home / Self Care | Source: Ambulatory Visit | Attending: Gastroenterology | Admitting: Gastroenterology

## 2024-05-25 DIAGNOSIS — R1011 Right upper quadrant pain: Secondary | ICD-10-CM | POA: Insufficient documentation

## 2024-05-25 MED ORDER — TECHNETIUM TC 99M MEBROFENIN IV KIT
5.5000 | PACK | Freq: Once | INTRAVENOUS | Status: AC
  Administered 2024-05-25: 5.5 via INTRAVENOUS
